# Patient Record
Sex: Female | Born: 1945 | Race: White | Hispanic: No | Marital: Married | Smoking: Never smoker
Health system: Southern US, Community
[De-identification: ages and names within clinical notes are randomized; demographics above are authoritative.]

## PROBLEM LIST (undated history)

## (undated) DIAGNOSIS — M3500 Sicca syndrome, unspecified: Secondary | ICD-10-CM

## (undated) DIAGNOSIS — K5909 Other constipation: Secondary | ICD-10-CM

## (undated) DIAGNOSIS — N200 Calculus of kidney: Secondary | ICD-10-CM

## (undated) DIAGNOSIS — K219 Gastro-esophageal reflux disease without esophagitis: Secondary | ICD-10-CM

## (undated) DIAGNOSIS — F419 Anxiety disorder, unspecified: Secondary | ICD-10-CM

## (undated) HISTORY — PX: DILATION AND CURETTAGE OF UTERUS: SHX78

## (undated) HISTORY — PX: ABDOMINAL HYSTERECTOMY: SHX81

## (undated) HISTORY — PX: COLONOSCOPY W/ BIOPSIES AND POLYPECTOMY: SHX1376

## (undated) HISTORY — PX: APPENDECTOMY: SHX54

## (undated) HISTORY — PX: TONSILLECTOMY: SUR1361

---

## 2006-06-16 DIAGNOSIS — M7750 Other enthesopathy of unspecified foot: Secondary | ICD-10-CM | POA: Insufficient documentation

## 2008-06-16 DIAGNOSIS — R1032 Left lower quadrant pain: Secondary | ICD-10-CM | POA: Insufficient documentation

## 2011-07-05 DIAGNOSIS — K146 Glossodynia: Secondary | ICD-10-CM | POA: Insufficient documentation

## 2011-07-05 DIAGNOSIS — R590 Localized enlarged lymph nodes: Secondary | ICD-10-CM | POA: Insufficient documentation

## 2011-12-02 DIAGNOSIS — G4700 Insomnia, unspecified: Secondary | ICD-10-CM | POA: Insufficient documentation

## 2011-12-27 DIAGNOSIS — F411 Generalized anxiety disorder: Secondary | ICD-10-CM | POA: Insufficient documentation

## 2011-12-27 DIAGNOSIS — F4322 Adjustment disorder with anxiety: Secondary | ICD-10-CM | POA: Insufficient documentation

## 2012-03-25 DIAGNOSIS — K5900 Constipation, unspecified: Secondary | ICD-10-CM | POA: Insufficient documentation

## 2012-03-25 DIAGNOSIS — K219 Gastro-esophageal reflux disease without esophagitis: Secondary | ICD-10-CM | POA: Insufficient documentation

## 2012-05-29 DIAGNOSIS — K219 Gastro-esophageal reflux disease without esophagitis: Secondary | ICD-10-CM | POA: Insufficient documentation

## 2012-05-29 DIAGNOSIS — R591 Generalized enlarged lymph nodes: Secondary | ICD-10-CM | POA: Insufficient documentation

## 2013-07-11 ENCOUNTER — Emergency Department (HOSPITAL_COMMUNITY)
Admission: EM | Admit: 2013-07-11 | Discharge: 2013-07-11 | Disposition: A | Discharge status: Home / Self Care | Payer: MEDICARE | Attending: Emergency Medicine | Admitting: Emergency Medicine

## 2013-07-11 ENCOUNTER — Emergency Department (HOSPITAL_COMMUNITY): Payer: MEDICARE

## 2013-07-11 ENCOUNTER — Encounter (HOSPITAL_COMMUNITY): Payer: Self-pay | Admitting: Emergency Medicine

## 2013-07-11 ENCOUNTER — Other Ambulatory Visit (HOSPITAL_COMMUNITY): Payer: Medicare Other

## 2013-07-11 DIAGNOSIS — Z79899 Other long term (current) drug therapy: Secondary | ICD-10-CM | POA: Insufficient documentation

## 2013-07-11 DIAGNOSIS — Z888 Allergy status to other drugs, medicaments and biological substances status: Secondary | ICD-10-CM | POA: Insufficient documentation

## 2013-07-11 DIAGNOSIS — W010XXA Fall on same level from slipping, tripping and stumbling without subsequent striking against object, initial encounter: Secondary | ICD-10-CM | POA: Insufficient documentation

## 2013-07-11 DIAGNOSIS — S0510XA Contusion of eyeball and orbital tissues, unspecified eye, initial encounter: Secondary | ICD-10-CM | POA: Insufficient documentation

## 2013-07-11 DIAGNOSIS — Z882 Allergy status to sulfonamides status: Secondary | ICD-10-CM | POA: Insufficient documentation

## 2013-07-11 DIAGNOSIS — S42209A Unspecified fracture of upper end of unspecified humerus, initial encounter for closed fracture: Secondary | ICD-10-CM | POA: Insufficient documentation

## 2013-07-11 DIAGNOSIS — M3500 Sicca syndrome, unspecified: Secondary | ICD-10-CM | POA: Insufficient documentation

## 2013-07-11 DIAGNOSIS — Y92009 Unspecified place in unspecified non-institutional (private) residence as the place of occurrence of the external cause: Secondary | ICD-10-CM | POA: Insufficient documentation

## 2013-07-11 DIAGNOSIS — Y93G3 Activity, cooking and baking: Secondary | ICD-10-CM | POA: Insufficient documentation

## 2013-07-11 DIAGNOSIS — R51 Headache: Secondary | ICD-10-CM | POA: Insufficient documentation

## 2013-07-11 DIAGNOSIS — S42201A Unspecified fracture of upper end of right humerus, initial encounter for closed fracture: Secondary | ICD-10-CM

## 2013-07-11 HISTORY — DX: Sjogren syndrome, unspecified: M35.00

## 2013-07-11 MED ORDER — OXYCODONE-ACETAMINOPHEN 5-325 MG PO TABS
1.0000 | ORAL_TABLET | Freq: Once | ORAL | Status: AC
  Administered 2013-07-11: 1 via ORAL
  Filled 2013-07-11: qty 1

## 2013-07-11 MED ORDER — OXYCODONE-ACETAMINOPHEN 5-325 MG PO TABS: 1.0000 | ORAL_TABLET | ORAL | Status: DC | PRN

## 2013-07-11 NOTE — Progress Notes (Signed)
Orthopedic Tech Progress Note Patient Details:  Sarah Costa March 28, 1946 [id number]  Ortho Devices Type of Ortho Device: Arm sling Ortho Device/Splint Interventions: Application   Shawnie Pons 07/11/2013, 12:42 PM

## 2013-07-11 NOTE — ED Notes (Signed)
Ortho called for sling 

## 2013-07-11 NOTE — ED Notes (Signed)
Pt reports turning around, tripped over the dog and fell. Landed on right shoulder and hit right side of face. Has swelling to right face and decreased rom right arm, +radial pulse.

## 2013-07-11 NOTE — ED Notes (Signed)
Dr Amanda Pea called and stated if pt needs ortho to call on call Supple

## 2013-07-11 NOTE — ED Provider Notes (Signed)
CSN: [id number]     Arrival date & time 07/11/13  1025 History   First MD Initiated Contact with Patient 07/11/13 1052     Chief Complaint  Patient presents with  . Fall  . Shoulder Pain   (Consider location/radiation/quality/duration/timing/severity/associated sxs/prior Treatment) The history is provided by the patient.   Patient reports she was making breakfast this morning and accidentally tripped over the dog, falling onto her right shoulder and right face.  Reports pain in her right upper arm and shoulder, worse with attempting to move it, 10/10 at worst, 0/10 at rest.  No weakness or numbness.  She hit her right face on the floor- denies any pain in her face despite the swelling.  Denies syncope.  Not on blood thinners.  Past Medical History  Diagnosis Date  . Sjogren's disease    History reviewed. No pertinent past surgical history. History reviewed. No pertinent family history. History  Substance Use Topics  . Smoking status: Never Smoker   . Smokeless tobacco: Not on file  . Alcohol Use: No   OB History   Grav Para Term Preterm Abortions TAB SAB Ect Mult Living                 Review of Systems  Cardiovascular: Negative for chest pain.  Gastrointestinal: Negative for abdominal pain.  Musculoskeletal: Negative for back pain and neck pain.  Skin: Negative for color change and wound.  Neurological: Negative for syncope, weakness, light-headedness, numbness and headaches.  Psychiatric/Behavioral: Negative for confusion.    Allergies  Aleve and Sulfa antibiotics  Home Medications   Current Outpatient Rx  Name  Route  Sig  Dispense  Refill  . ALPRAZolam (XANAX) 1 MG tablet   Oral   Take 1 mg by mouth at bedtime.         Marland Kitchen antiseptic oral rinse (BIOTENE) LIQD   Mouth Rinse   15 mLs by Mouth Rinse route 4 (four) times daily as needed for dry mouth.         . escitalopram (LEXAPRO) 10 MG tablet   Oral   Take 10 mg by mouth daily.         Marland Kitchen esomeprazole  (NEXIUM) 20 MG capsule   Oral   Take 20 mg by mouth 2 (two) times daily before a meal.         . Polyethyl Glycol-Propyl Glycol (SYSTANE OP)   Both Eyes   Place 1 drop into both eyes daily as needed (for dry eyes).          BP 130/67  Pulse 77  Temp(Src) 97.7 F (36.5 C) (Oral)  Resp 18  SpO2 99% Physical Exam  Nursing note and vitals reviewed. Constitutional: She appears well-developed and well-nourished. No distress.  HENT:  Head: Normocephalic. Head is with contusion.    Eyes: EOM are normal. No scleral icterus.  Neck: Normal range of motion. Neck supple.  Pulmonary/Chest: Effort normal.  Musculoskeletal:       Right shoulder: She exhibits decreased range of motion, tenderness, bony tenderness and pain. She exhibits no swelling, no effusion, no crepitus, no deformity, no laceration, no spasm and normal pulse.       Arms: Spine nontender, no crepitus, or stepoffs. Extremities with exception of RUE:  Strength 5/5, sensation intact, distal pulses intact.     Neurological: She is alert. No sensory deficit. She exhibits normal muscle tone. GCS eye subscore is 4. GCS verbal subscore is 5. GCS motor subscore is 6.  Moves all extremities  Skin: She is not diaphoretic. No pallor.  Psychiatric: She has a normal mood and affect. Her behavior is normal. Thought content normal.    ED Course  Procedures (including critical care time) Labs Review Labs Reviewed - No data to display Imaging Review No results found.  EKG Interpretation   None      Dr Wilkie Aye made aware of the patient.   Patient requests to f/u with Dr Rennis Chris.  MDM   1. Proximal humerus fracture, right, closed, initial encounter    Pt with accidental mechanical fall this morning onto her right arm and right face resulting in proximal humerus fracture.  Neurovascularly intact.  Not on blood thinners. No facial fracture.  No headache or neurologic complaints.  D/C home with sling, percocet, ortho follow up.   Discussed all results with patient.  Pt given return precautions.  Pt verbalizes understanding and agrees with plan.      Trixie Dredge, PA-C 07/11/13 1331

## 2013-07-13 NOTE — ED Provider Notes (Signed)
Medical screening examination/treatment/procedure(s) were conducted as a shared visit with non-physician practitioner(s) and myself.  I personally evaluated the patient during the encounter.  EKG Interpretation   None       Patient presents following mechanical fall.  HA and right shoulder pain.  NOt on anticoagulants.  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  CT neg.  RIght humerus fracture.  Patient will be placed in sling and follow-up with orthopedics.  Shon Baton, MD 07/13/13 [phone]

## 2013-07-20 ENCOUNTER — Encounter (HOSPITAL_COMMUNITY): Payer: Self-pay

## 2013-07-21 ENCOUNTER — Encounter (HOSPITAL_COMMUNITY): Payer: Self-pay | Admitting: *Deleted

## 2013-07-21 MED ORDER — DEXTROSE 5 % IV SOLN
3.0000 g | INTRAVENOUS | Status: AC
  Administered 2013-07-22: 2 g via INTRAVENOUS
  Filled 2013-07-21: qty 3000

## 2013-07-22 ENCOUNTER — Encounter (HOSPITAL_COMMUNITY): Payer: MEDICARE | Admitting: Anesthesiology

## 2013-07-22 ENCOUNTER — Encounter (HOSPITAL_COMMUNITY): Payer: Self-pay | Admitting: *Deleted

## 2013-07-22 ENCOUNTER — Ambulatory Visit (HOSPITAL_COMMUNITY): Payer: MEDICARE

## 2013-07-22 ENCOUNTER — Ambulatory Visit (HOSPITAL_COMMUNITY): Payer: MEDICARE | Admitting: Anesthesiology

## 2013-07-22 ENCOUNTER — Encounter (HOSPITAL_COMMUNITY)
Admission: RE | Disposition: A | Discharge status: Home / Self Care | Payer: Self-pay | Source: Ambulatory Visit | Attending: Orthopedic Surgery

## 2013-07-22 ENCOUNTER — Observation Stay (HOSPITAL_COMMUNITY)
Admission: RE | Admit: 2013-07-22 | Discharge: 2013-07-23 | Disposition: A | Discharge status: Home / Self Care | Payer: MEDICARE | Source: Ambulatory Visit | Attending: Orthopedic Surgery | Admitting: Orthopedic Surgery

## 2013-07-22 DIAGNOSIS — K5900 Constipation, unspecified: Secondary | ICD-10-CM | POA: Insufficient documentation

## 2013-07-22 DIAGNOSIS — S42213A Unspecified displaced fracture of surgical neck of unspecified humerus, initial encounter for closed fracture: Principal | ICD-10-CM | POA: Insufficient documentation

## 2013-07-22 DIAGNOSIS — M3500 Sicca syndrome, unspecified: Secondary | ICD-10-CM | POA: Insufficient documentation

## 2013-07-22 DIAGNOSIS — F411 Generalized anxiety disorder: Secondary | ICD-10-CM | POA: Insufficient documentation

## 2013-07-22 DIAGNOSIS — W19XXXA Unspecified fall, initial encounter: Secondary | ICD-10-CM | POA: Insufficient documentation

## 2013-07-22 DIAGNOSIS — S42209A Unspecified fracture of upper end of unspecified humerus, initial encounter for closed fracture: Secondary | ICD-10-CM

## 2013-07-22 DIAGNOSIS — K219 Gastro-esophageal reflux disease without esophagitis: Secondary | ICD-10-CM | POA: Insufficient documentation

## 2013-07-22 HISTORY — DX: Anxiety disorder, unspecified: F41.9

## 2013-07-22 HISTORY — DX: Calculus of kidney: N20.0

## 2013-07-22 HISTORY — PX: ORIF HUMERUS FRACTURE: SHX2126

## 2013-07-22 HISTORY — DX: Other constipation: K59.09

## 2013-07-22 HISTORY — DX: Gastro-esophageal reflux disease without esophagitis: K21.9

## 2013-07-22 LAB — COMPREHENSIVE METABOLIC PANEL
ALBUMIN: 3.6 g/dL (ref 3.5–5.2)
ALK PHOS: 99 U/L (ref 39–117)
ALT: 15 U/L (ref 0–35)
AST: 25 U/L (ref 0–37)
BUN: 17 mg/dL (ref 6–23)
CALCIUM: 9.3 mg/dL (ref 8.4–10.5)
CO2: 22 mEq/L (ref 19–32)
Chloride: 105 mEq/L (ref 96–112)
Creatinine, Ser: 0.8 mg/dL (ref 0.50–1.10)
GFR calc Af Amer: 86 mL/min — ABNORMAL LOW (ref >90)
GFR calc non Af Amer: 75 mL/min — ABNORMAL LOW (ref >90)
GLUCOSE: 95 mg/dL (ref 70–99)
POTASSIUM: 4.7 meq/L (ref 3.7–5.3)
SODIUM: 141 meq/L (ref 137–147)
Total Bilirubin: 0.6 mg/dL (ref 0.3–1.2)
Total Protein: 6.7 g/dL (ref 6.0–8.3)

## 2013-07-22 LAB — CBC WITH DIFFERENTIAL/PLATELET
BASOS PCT: 1 % (ref 0–1)
Basophils Absolute: 0 10*3/uL (ref 0.0–0.1)
EOS PCT: 3 % (ref 0–5)
Eosinophils Absolute: 0.1 10*3/uL (ref 0.0–0.7)
HEMATOCRIT: 34.7 % — AB (ref 36.0–46.0)
Hemoglobin: 11.4 g/dL — ABNORMAL LOW (ref 12.0–15.0)
Lymphocytes Relative: 14 % (ref 12–46)
Lymphs Abs: 0.5 10*3/uL — ABNORMAL LOW (ref 0.7–4.0)
MCH: 27.5 pg (ref 26.0–34.0)
MCHC: 32.9 g/dL (ref 30.0–36.0)
MCV: 83.6 fL (ref 78.0–100.0)
MONO ABS: 0.5 10*3/uL (ref 0.1–1.0)
MONOS PCT: 15 % — AB (ref 3–12)
NEUTROS ABS: 2.2 10*3/uL (ref 1.7–7.7)
Neutrophils Relative %: 68 % (ref 43–77)
Platelets: 264 10*3/uL (ref 150–400)
RBC: 4.15 MIL/uL (ref 3.87–5.11)
RDW: 15.2 % (ref 11.5–15.5)
WBC: 3.3 10*3/uL — ABNORMAL LOW (ref 4.0–10.5)

## 2013-07-22 LAB — APTT: APTT: 27 s (ref 24–37)

## 2013-07-22 LAB — PROTIME-INR
INR: 0.96 (ref 0.00–1.49)
Prothrombin Time: 12.6 seconds (ref 11.6–15.2)

## 2013-07-22 SURGERY — OPEN REDUCTION INTERNAL FIXATION (ORIF) PROXIMAL HUMERUS FRACTURE
Anesthesia: General | Site: Shoulder | Laterality: Right

## 2013-07-22 MED ORDER — ONDANSETRON HCL 4 MG/2ML IJ SOLN
INTRAMUSCULAR | Status: DC | PRN
  Administered 2013-07-22: 4 mg via INTRAVENOUS

## 2013-07-22 MED ORDER — ONDANSETRON HCL 4 MG PO TABS
4.0000 mg | ORAL_TABLET | Freq: Four times a day (QID) | ORAL | Status: DC | PRN
Start: 2013-07-22 — End: 2013-07-23

## 2013-07-22 MED ORDER — HYDROMORPHONE HCL 2 MG PO TABS
2.0000 mg | ORAL_TABLET | ORAL | Status: DC | PRN
  Administered 2013-07-23: 2 mg via ORAL
  Filled 2013-07-22: qty 1

## 2013-07-22 MED ORDER — LACTATED RINGERS IV SOLN
INTRAVENOUS | Status: DC
  Administered 2013-07-22: 14:00:00 via INTRAVENOUS

## 2013-07-22 MED ORDER — ONDANSETRON HCL 4 MG/2ML IJ SOLN: 4.0000 mg | Freq: Once | INTRAMUSCULAR | Status: DC | PRN

## 2013-07-22 MED ORDER — ZOLPIDEM TARTRATE 5 MG PO TABS: 5.0000 mg | ORAL_TABLET | Freq: Every evening | ORAL | Status: DC | PRN

## 2013-07-22 MED ORDER — DIAZEPAM 5 MG PO TABS
5.0000 mg | ORAL_TABLET | Freq: Four times a day (QID) | ORAL | Status: DC | PRN
  Administered 2013-07-22 – 2013-07-23 (×2): 5 mg via ORAL
  Filled 2013-07-22 (×2): qty 1

## 2013-07-22 MED ORDER — OSELTAMIVIR PHOSPHATE 75 MG PO CAPS
75.0000 mg | ORAL_CAPSULE | Freq: Two times a day (BID) | ORAL | Status: DC
  Administered 2013-07-22 – 2013-07-23 (×2): 75 mg via ORAL
  Filled 2013-07-22 (×4): qty 1

## 2013-07-22 MED ORDER — FLEET ENEMA 7-19 GM/118ML RE ENEM: 1.0000 | ENEMA | Freq: Once | RECTAL | Status: AC | PRN

## 2013-07-22 MED ORDER — WHITE PETROLATUM GEL
Status: AC
  Administered 2013-07-22
  Filled 2013-07-22: qty 5

## 2013-07-22 MED ORDER — ALBUMIN HUMAN 5 % IV SOLN
INTRAVENOUS | Status: DC | PRN
  Administered 2013-07-22: 16:00:00 via INTRAVENOUS

## 2013-07-22 MED ORDER — CEFAZOLIN SODIUM-DEXTROSE 2-3 GM-% IV SOLR
INTRAVENOUS | Status: AC
  Filled 2013-07-22: qty 50

## 2013-07-22 MED ORDER — CHLORHEXIDINE GLUCONATE 4 % EX LIQD: 60.0000 mL | Freq: Once | CUTANEOUS | Status: DC

## 2013-07-22 MED ORDER — CEFAZOLIN SODIUM 1-5 GM-% IV SOLN
1.0000 g | Freq: Four times a day (QID) | INTRAVENOUS | Status: AC
  Administered 2013-07-22 – 2013-07-23 (×3): 1 g via INTRAVENOUS
  Filled 2013-07-22 (×3): qty 50

## 2013-07-22 MED ORDER — 0.9 % SODIUM CHLORIDE (POUR BTL) OPTIME
TOPICAL | Status: DC | PRN
  Administered 2013-07-22: 1000 mL

## 2013-07-22 MED ORDER — TEMAZEPAM 15 MG PO CAPS: 15.0000 mg | ORAL_CAPSULE | Freq: Every evening | ORAL | Status: DC | PRN

## 2013-07-22 MED ORDER — PHENYLEPHRINE HCL 10 MG/ML IJ SOLN
10.0000 mg | INTRAVENOUS | Status: DC | PRN
  Administered 2013-07-22: 40 ug/min via INTRAVENOUS

## 2013-07-22 MED ORDER — DIPHENHYDRAMINE HCL 12.5 MG/5ML PO ELIX: 12.5000 mg | ORAL_SOLUTION | ORAL | Status: DC | PRN

## 2013-07-22 MED ORDER — LIDOCAINE HCL 4 % MT SOLN
OROMUCOSAL | Status: DC | PRN
  Administered 2013-07-22: 30 mL via TOPICAL

## 2013-07-22 MED ORDER — ROCURONIUM BROMIDE 100 MG/10ML IV SOLN
INTRAVENOUS | Status: DC | PRN
  Administered 2013-07-22: 15 mg via INTRAVENOUS
  Administered 2013-07-22: 25 mg via INTRAVENOUS

## 2013-07-22 MED ORDER — LACTATED RINGERS IV SOLN
INTRAVENOUS | Status: DC | PRN
  Administered 2013-07-22 (×2): via INTRAVENOUS

## 2013-07-22 MED ORDER — HYDROMORPHONE HCL PF 1 MG/ML IJ SOLN
INTRAMUSCULAR | Status: AC
  Administered 2013-07-22: 1 mg
  Filled 2013-07-22: qty 1

## 2013-07-22 MED ORDER — GLYCOPYRROLATE 0.2 MG/ML IJ SOLN
INTRAMUSCULAR | Status: DC | PRN
  Administered 2013-07-22: 0.2 mg via INTRAVENOUS

## 2013-07-22 MED ORDER — PHENOL 1.4 % MT LIQD: 1.0000 | OROMUCOSAL | Status: DC | PRN

## 2013-07-22 MED ORDER — FENTANYL CITRATE 0.05 MG/ML IJ SOLN
INTRAMUSCULAR | Status: AC
  Administered 2013-07-22: 100 ug
  Filled 2013-07-22: qty 2

## 2013-07-22 MED ORDER — METOCLOPRAMIDE HCL 5 MG/ML IJ SOLN: 5.0000 mg | Freq: Three times a day (TID) | INTRAMUSCULAR | Status: DC | PRN

## 2013-07-22 MED ORDER — BISACODYL 5 MG PO TBEC: 5.0000 mg | DELAYED_RELEASE_TABLET | Freq: Every day | ORAL | Status: DC | PRN

## 2013-07-22 MED ORDER — HYDROMORPHONE HCL PF 1 MG/ML IJ SOLN: 0.2500 mg | INTRAMUSCULAR | Status: DC | PRN

## 2013-07-22 MED ORDER — DOCUSATE SODIUM 100 MG PO CAPS
100.0000 mg | ORAL_CAPSULE | Freq: Two times a day (BID) | ORAL | Status: DC
  Administered 2013-07-22 – 2013-07-23 (×2): 100 mg via ORAL
  Filled 2013-07-22 (×2): qty 1

## 2013-07-22 MED ORDER — ALUM & MAG HYDROXIDE-SIMETH 200-200-20 MG/5ML PO SUSP: 30.0000 mL | ORAL | Status: DC | PRN

## 2013-07-22 MED ORDER — HYDROMORPHONE HCL PF 1 MG/ML IJ SOLN
0.2500 mg | INTRAMUSCULAR | Status: DC | PRN
  Administered 2013-07-22 – 2013-07-23 (×2): 1 mg via INTRAVENOUS
  Filled 2013-07-22 (×2): qty 1

## 2013-07-22 MED ORDER — PANTOPRAZOLE SODIUM 40 MG PO TBEC
40.0000 mg | DELAYED_RELEASE_TABLET | Freq: Every day | ORAL | Status: DC
  Administered 2013-07-22 – 2013-07-23 (×2): 40 mg via ORAL
  Filled 2013-07-22: qty 1

## 2013-07-22 MED ORDER — LIDOCAINE HCL (CARDIAC) 20 MG/ML IV SOLN
INTRAVENOUS | Status: DC | PRN
  Administered 2013-07-22: 70 mg via INTRAVENOUS

## 2013-07-22 MED ORDER — NEOSTIGMINE METHYLSULFATE 1 MG/ML IJ SOLN
INTRAMUSCULAR | Status: DC | PRN
  Administered 2013-07-22: 3 mg via INTRAVENOUS

## 2013-07-22 MED ORDER — ACETAMINOPHEN 650 MG RE SUPP: 650.0000 mg | Freq: Four times a day (QID) | RECTAL | Status: DC | PRN

## 2013-07-22 MED ORDER — MENTHOL 3 MG MT LOZG
LOZENGE | OROMUCOSAL | Status: AC
  Filled 2013-07-22: qty 9

## 2013-07-22 MED ORDER — MENTHOL 3 MG MT LOZG: 1.0000 | LOZENGE | OROMUCOSAL | Status: DC | PRN

## 2013-07-22 MED ORDER — PROPOFOL 10 MG/ML IV BOLUS
INTRAVENOUS | Status: DC | PRN
  Administered 2013-07-22: 150 mg via INTRAVENOUS

## 2013-07-22 MED ORDER — METOCLOPRAMIDE HCL 10 MG PO TABS: 5.0000 mg | ORAL_TABLET | Freq: Three times a day (TID) | ORAL | Status: DC | PRN

## 2013-07-22 MED ORDER — FENTANYL CITRATE 0.05 MG/ML IJ SOLN
INTRAMUSCULAR | Status: DC | PRN
  Administered 2013-07-22: 100 ug via INTRAVENOUS

## 2013-07-22 MED ORDER — LACTATED RINGERS IV SOLN: INTRAVENOUS | Status: DC

## 2013-07-22 MED ORDER — ESCITALOPRAM OXALATE 10 MG PO TABS
10.0000 mg | ORAL_TABLET | Freq: Every day | ORAL | Status: DC
  Administered 2013-07-23: 10 mg via ORAL
  Filled 2013-07-22 (×2): qty 1

## 2013-07-22 MED ORDER — ONDANSETRON HCL 4 MG/2ML IJ SOLN: 4.0000 mg | Freq: Four times a day (QID) | INTRAMUSCULAR | Status: DC | PRN

## 2013-07-22 MED ORDER — BUPIVACAINE-EPINEPHRINE PF 0.5-1:200000 % IJ SOLN
INTRAMUSCULAR | Status: DC | PRN
  Administered 2013-07-22: 16 mL

## 2013-07-22 MED ORDER — POLYETHYLENE GLYCOL 3350 17 G PO PACK: 17.0000 g | PACK | Freq: Every day | ORAL | Status: DC | PRN

## 2013-07-22 MED ORDER — ACETAMINOPHEN 325 MG PO TABS
650.0000 mg | ORAL_TABLET | Freq: Four times a day (QID) | ORAL | Status: DC | PRN
  Administered 2013-07-23: 650 mg via ORAL
  Filled 2013-07-22: qty 2

## 2013-07-22 SURGICAL SUPPLY — 65 items
BIT DRILL 2.5X110 QC LCP DISP (BIT) ×2 IMPLANT
BIT DRILL PERC QC 2.8X200 100 (BIT) ×1 IMPLANT
CLOTH BEACON ORANGE TIMEOUT ST (SAFETY) ×2 IMPLANT
CLSR STERI-STRIP ANTIMIC 1/2X4 (GAUZE/BANDAGES/DRESSINGS) ×2 IMPLANT
DRAPE INCISE IOBAN 66X45 STRL (DRAPES) ×2 IMPLANT
DRAPE POUCH INSTRU U-SHP 10X18 (DRAPES) ×2 IMPLANT
DRAPE SURG 17X11 SM STRL (DRAPES) ×2 IMPLANT
DRAPE U-SHAPE 47X51 STRL (DRAPES) ×2 IMPLANT
DRILL BIT QUICK COUP 2.8MM 100 (BIT) ×1
DRSG AQUACEL AG ADV 3.5X10 (GAUZE/BANDAGES/DRESSINGS) ×2 IMPLANT
DRSG EMULSION OIL 3X3 NADH (GAUZE/BANDAGES/DRESSINGS) IMPLANT
DRSG PAD ABDOMINAL 8X10 ST (GAUZE/BANDAGES/DRESSINGS) IMPLANT
ELECT REM PT RETURN 9FT ADLT (ELECTROSURGICAL) ×2
ELECTRODE REM PT RTRN 9FT ADLT (ELECTROSURGICAL) ×1 IMPLANT
GLOVE BIO SURGEON STRL SZ7 (GLOVE) ×2 IMPLANT
GLOVE BIO SURGEON STRL SZ7.5 (GLOVE) ×2 IMPLANT
GLOVE BIO SURGEON STRL SZ8 (GLOVE) ×4 IMPLANT
GLOVE BIOGEL PI IND STRL 7.0 (GLOVE) ×1 IMPLANT
GLOVE BIOGEL PI INDICATOR 7.0 (GLOVE) ×1
GLOVE EUDERMIC 7 POWDERFREE (GLOVE) ×2 IMPLANT
GLOVE SS BIOGEL STRL SZ 7.5 (GLOVE) ×1 IMPLANT
GLOVE SUPERSENSE BIOGEL SZ 7.5 (GLOVE) ×1
GLOVE SURG SS PI 7.0 STRL IVOR (GLOVE) ×2 IMPLANT
GLOVE SURG SS PI 8.5 STRL IVOR (GLOVE) ×2
GLOVE SURG SS PI 8.5 STRL STRW (GLOVE) ×2 IMPLANT
GOWN L4 LG 24 PK N/S (GOWN DISPOSABLE) ×2 IMPLANT
GOWN L4 XXLG W/PAP TWL (GOWN DISPOSABLE) ×6 IMPLANT
GOWN STRL NON-REIN LRG LVL3 (GOWN DISPOSABLE) IMPLANT
GOWN STRL REIN XL XLG (GOWN DISPOSABLE) IMPLANT
GOWN STRL REUS W/TWL XL LVL4 (GOWN DISPOSABLE) ×4 IMPLANT
KIT BASIN OR (CUSTOM PROCEDURE TRAY) ×2 IMPLANT
KIT ROOM TURNOVER OR (KITS) ×2 IMPLANT
MANIFOLD NEPTUNE II (INSTRUMENTS) ×2 IMPLANT
NEEDLE 22X1 1/2 (OR ONLY) (NEEDLE) IMPLANT
NS IRRIG 1000ML POUR BTL (IV SOLUTION) ×2 IMPLANT
PACK SHOULDER (CUSTOM PROCEDURE TRAY) ×2 IMPLANT
PAD ARMBOARD 7.5X6 YLW CONV (MISCELLANEOUS) ×4 IMPLANT
PLATE LCP 3.5 PROX HUM 3HX90 (Plate) ×2 IMPLANT
SCREW CORTEX 3.5 22MM (Screw) ×1 IMPLANT
SCREW CORTEX 3.5 24MM (Screw) ×2 IMPLANT
SCREW LOCK CORT ST 3.5X22 (Screw) ×1 IMPLANT
SCREW LOCK CORT ST 3.5X24 (Screw) ×2 IMPLANT
SCREW LOCK T15 FT 36X3.5X2.9X (Screw) ×3 IMPLANT
SCREW LOCK T15 FT 38X3.5XST (Screw) ×1 IMPLANT
SCREW LOCKING 3.5X34 (Screw) ×2 IMPLANT
SCREW LOCKING 3.5X36 (Screw) ×3 IMPLANT
SCREW LOCKING 3.5X38 (Screw) ×1 IMPLANT
SPONGE GAUZE 4X4 12PLY (GAUZE/BANDAGES/DRESSINGS) IMPLANT
SPONGE LAP 18X18 X RAY DECT (DISPOSABLE) ×2 IMPLANT
STRIP CLOSURE SKIN 1/2X4 (GAUZE/BANDAGES/DRESSINGS) IMPLANT
SUCTION FRAZIER TIP 10 FR DISP (SUCTIONS) ×2 IMPLANT
SUT ETHIBOND NAB CT1 #1 30IN (SUTURE) IMPLANT
SUT FIBERWIRE #2 38 T-5 BLUE (SUTURE)
SUT MNCRL AB 3-0 PS2 18 (SUTURE) ×2 IMPLANT
SUT VIC AB 0 CT1 27 (SUTURE) ×1
SUT VIC AB 0 CT1 27XBRD ANBCTR (SUTURE) ×1 IMPLANT
SUT VIC AB 2-0 CT1 27 (SUTURE) ×1
SUT VIC AB 2-0 CT1 TAPERPNT 27 (SUTURE) ×1 IMPLANT
SUT VICRYL 4-0 PS2 18IN ABS (SUTURE) IMPLANT
SUTURE FIBERWR #2 38 T-5 BLUE (SUTURE) IMPLANT
SYR CONTROL 10ML LL (SYRINGE) IMPLANT
TOWEL OR 17X24 6PK STRL BLUE (TOWEL DISPOSABLE) ×2 IMPLANT
TOWEL OR 17X26 10 PK STRL BLUE (TOWEL DISPOSABLE) ×2 IMPLANT
WATER STERILE IRR 1000ML POUR (IV SOLUTION) IMPLANT
YANKAUER SUCT BULB TIP NO VENT (SUCTIONS) ×2 IMPLANT

## 2013-07-22 NOTE — H&P (Signed)
Sarah Costa    Chief Complaint: right proximal humerous fracture HPI: The patient is a 67 y.o. female with a displaced right 3 part proximal humerus fracture  Past Medical History  Diagnosis Date  . Sjogren's disease   . Chronic constipation     Hx; of  . Anxiety   . GERD (gastroesophageal reflux disease)   . Stone, kidney     Hx: of    Past Surgical History  Procedure Laterality Date  . Abdominal hysterectomy    . Colonoscopy w/ biopsies and polypectomy      Hx: of   . Appendectomy    . Tonsillectomy    . Dilation and curettage of uterus      Family History  Problem Relation Age of Onset  . Hypertension Mother   . Diabetes Mother   . Stroke Mother   . Lymphoma Father   . Hypertension Brother     Social History:  reports that she has never smoked. She has never used smokeless tobacco. She reports that she does not drink alcohol or use illicit drugs.  Allergies:  Allergies  Allergen Reactions  . Aleve [Naproxen Sodium] Swelling  . Sulfa Antibiotics Swelling    Medications Prior to Admission  Medication Sig Dispense Refill  . antiseptic oral rinse (BIOTENE) LIQD 15 mLs by Mouth Rinse route 4 (four) times daily as needed for dry mouth.      . diazepam (VALIUM) 5 MG tablet Take 5 mg by mouth at bedtime as needed for anxiety.      Marland Kitchen escitalopram (LEXAPRO) 10 MG tablet Take 10 mg by mouth daily.      Marland Kitchen esomeprazole (NEXIUM) 20 MG capsule Take 20 mg by mouth 2 (two) times daily before a meal.      . HYDROmorphone (DILAUDID) 2 MG tablet Take 2-4 mg by mouth at bedtime as needed for severe pain.      Marland Kitchen oseltamivir (TAMIFLU) 75 MG capsule Take 75 mg by mouth.      Vladimir Faster Glycol-Propyl Glycol (SYSTANE OP) Place 1 drop into both eyes daily as needed (for dry eyes).      . polyethylene glycol powder (GLYCOLAX/MIRALAX) powder Take 1 Container by mouth once.         Physical Exam: right shoulder with painful and restricted motion and xrays showing displaced right 3  part proximal humerus fracture  Vitals  Temp:  [98.2 F (36.8 C)] 98.2 F (36.8 C) (01/08 1329) Pulse Rate:  [62-81] 63 (01/08 1422) Resp:  [13-20] 15 (01/08 1422) BP: (142)/(60) 142/60 mmHg (01/08 1329) SpO2:  [100 %] 100 % (01/08 1422) Weight:  [52.164 kg (115 lb)] 52.164 kg (115 lb) (01/08 1329)  Assessment/Plan  Impression: right proximal humerous fracture  Plan of Action: Procedure(s): OPEN REDUCTION INTERNAL FIXATION (ORIF) RIGHT PROXIMAL HUMERUS FRACTURE  Sarah Costa M 07/22/2013, 2:22 PM

## 2013-07-22 NOTE — Op Note (Signed)
07/22/2013  4:41 PM  PATIENT:   Sarah Costa  68 y.o. female  PRE-OPERATIVE DIAGNOSIS:  3 part right proximal humerus fracture  POST-OPERATIVE DIAGNOSIS:  Same  PROCEDURE:  ORIF with locking plate  SURGEON:  Ebonique Hallstrom, Metta Clines M.D.  ASSISTANTS: Shuford pac   ANESTHESIA:   GET + ISB  EBL: 200cc  SPECIMEN:  none  Drains: none   PATIENT DISPOSITION:  PACU - hemodynamically stable.    PLAN OF CARE: Admit for overnight observation  Dictation# [id number]

## 2013-07-22 NOTE — Anesthesia Preprocedure Evaluation (Signed)
Anesthesia Evaluation  Patient identified by MRN, date of birth, ID band Patient awake    Reviewed: Allergy & Precautions, H&P , NPO status , Patient's Chart, lab work & pertinent test results  Airway       Dental   Pulmonary          Cardiovascular     Neuro/Psych    GI/Hepatic GERD-  ,  Endo/Other    Renal/GU Renal disease     Musculoskeletal   Abdominal   Peds  Hematology   Anesthesia Other Findings   Reproductive/Obstetrics                           Anesthesia Physical Anesthesia Plan  ASA: II  Anesthesia Plan: General   Post-op Pain Management:    Induction: Intravenous  Airway Management Planned: Oral ETT  Additional Equipment:   Intra-op Plan:   Post-operative Plan: Extubation in OR  Informed Consent: I have reviewed the patients History and Physical, chart, labs and discussed the procedure including the risks, benefits and alternatives for the proposed anesthesia with the patient or authorized representative who has indicated his/her understanding and acceptance.     Plan Discussed with:   Anesthesia Plan Comments:         Anesthesia Quick Evaluation

## 2013-07-22 NOTE — Anesthesia Procedure Notes (Addendum)
Anesthesia Regional Block:  Interscalene brachial plexus block  Pre-Anesthetic Checklist: ,, timeout performed, Correct Patient, Correct Site, Correct Laterality, Correct Procedure, Correct Position, site marked, Risks and benefits discussed,  Surgical consent,  Pre-op evaluation,  At surgeon's request and post-op pain management  Laterality: Right  Prep: chloraprep and alcohol swabs       Needles:  Injection technique: Single-shot  Needle Type: Stimulator Needle - 40          Additional Needles:  Procedures: nerve stimulator Interscalene brachial plexus block  Nerve Stimulator or Paresthesia:  Response: 0.5 mA, 0.1 ms, 3 cm  Additional Responses:   Narrative:  Start time: 07/22/2013 2:15 PM End time: 07/22/2013 2:20 PM Injection made incrementally with aspirations every 5 mL.  Performed by: Personally  Anesthesiologist: Sharolyn Douglas MD  Additional Notes: Pt accepts procedure w/ risks. 16cc 0.5% Marcaine w/ epi w/o difficulty or discomfort. GES   Procedure Name: Intubation Date/Time: 07/22/2013 3:00 PM Performed by: Izora Gala Pre-anesthesia Checklist: Patient identified, Emergency Drugs available, Suction available and Patient being monitored Patient Re-evaluated:Patient Re-evaluated prior to inductionOxygen Delivery Method: Circle system utilized Preoxygenation: Pre-oxygenation with 100% oxygen Intubation Type: IV induction Ventilation: Mask ventilation without difficulty Laryngoscope Size: Miller and 3 Grade View: Grade I Tube type: Oral Tube size: 7.0 mm Number of attempts: 1 Airway Equipment and Method: Stylet Placement Confirmation: ETT inserted through vocal cords under direct vision,  positive ETCO2 and breath sounds checked- equal and bilateral Secured at: 20 cm Tube secured with: Tape Dental Injury: Teeth and Oropharynx as per pre-operative assessment

## 2013-07-22 NOTE — Transfer of Care (Signed)
Immediate Anesthesia Transfer of Care Note  Patient: Sarah Costa  Procedure(s) Performed: Procedure(s): OPEN REDUCTION INTERNAL FIXATION (ORIF) RIGHT PROXIMAL HUMERUS FRACTURE (Right)  Patient Location: PACU  Anesthesia Type:General  Level of Consciousness: awake, oriented and patient cooperative  Airway & Oxygen Therapy: Patient Spontanous Breathing and Patient connected to nasal cannula oxygen  Post-op Assessment: Report given to PACU RN and Post -op Vital signs reviewed and stable  Post vital signs: Reviewed and stable  Complications: No apparent anesthesia complications

## 2013-07-22 NOTE — Anesthesia Postprocedure Evaluation (Signed)
  Anesthesia Post-op Note  Patient: Sarah Costa  Procedure(s) Performed: Procedure(s): OPEN REDUCTION INTERNAL FIXATION (ORIF) RIGHT PROXIMAL HUMERUS FRACTURE (Right)  Patient Location: PACU  Anesthesia Type:GA combined with regional for post-op pain  Level of Consciousness: awake  Airway and Oxygen Therapy: Patient Spontanous Breathing  Post-op Pain: none  Post-op Assessment: Post-op Vital signs reviewed, Patient's Cardiovascular Status Stable, Respiratory Function Stable, Patent Airway, No signs of Nausea or vomiting and Pain level controlled  Post-op Vital Signs: Reviewed and stable  Complications: No apparent anesthesia complications

## 2013-07-23 MED ORDER — HYDROCODONE-ACETAMINOPHEN 5-325 MG PO TABS: 1.0000 | ORAL_TABLET | ORAL | Status: AC | PRN

## 2013-07-23 MED ORDER — HYDROMORPHONE HCL 2 MG PO TABS: 2.0000 mg | ORAL_TABLET | ORAL | Status: AC | PRN

## 2013-07-23 MED ORDER — DIAZEPAM 5 MG PO TABS: 2.5000 mg | ORAL_TABLET | Freq: Four times a day (QID) | ORAL | Status: AC | PRN

## 2013-07-23 NOTE — Discharge Summary (Signed)
PATIENT ID:      Sarah Costa  MRN:     [id number] DOB/AGE:    February 22, 1946 / 68 y.o.     DISCHARGE SUMMARY  ADMISSION DATE:    07/22/2013 DISCHARGE DATE:    ADMISSION DIAGNOSIS: right proximal humerus fracture Past Medical History  Diagnosis Date  . Sjogren's disease   . Chronic constipation     Hx; of  . Anxiety   . GERD (gastroesophageal reflux disease)   . Stone, kidney     Hx: of    DISCHARGE DIAGNOSIS:   Active Problems:   Proximal humerus fracture   PROCEDURE: Procedure(s): OPEN REDUCTION INTERNAL FIXATION (ORIF) RIGHT PROXIMAL HUMERUS FRACTURE on 07/22/2013  CONSULTS:   none  HISTORY:  See H&P in chart.  HOSPITAL COURSE:  Sarah Costa is a 68 y.o. admitted on 07/22/2013 with a chief complaint of right shoulder pain and fracture from mechanical fall, and found to have a diagnosis of right proximal humerus fracture.  They were brought to the operating room on 07/22/2013 and underwent Procedure(s): OPEN REDUCTION INTERNAL FIXATION (ORIF) RIGHT PROXIMAL HUMERUS FRACTURE.    They were given perioperative antibiotics: Anti-infectives   Start     Dose/Rate Route Frequency Ordered Stop   07/22/13 2200  oseltamivir (TAMIFLU) capsule 75 mg     75 mg Oral 2 times daily 07/22/13 2014 07/27/13 2159   07/22/13 2015  ceFAZolin (ANCEF) IVPB 1 g/50 mL premix     1 g 100 mL/hr over 30 Minutes Intravenous Every 6 hours 07/22/13 2014 07/23/13 1414   07/22/13 0600  ceFAZolin (ANCEF) 3 g in dextrose 5 % 50 mL IVPB     3 g 160 mL/hr over 30 Minutes Intravenous On call to O.R. 07/21/13 1355 07/22/13 1510    .  Patient underwent the above named procedure and tolerated it well. The following day they were hemodynamically stable and while still in pain it was controlled on oral analgesics. They were neurovascularly intact to the operative extremity. OT was ordered and worked with patient per protocol. They were medically and orthopaedically stable for discharge on day 1.    DIAGNOSTIC  STUDIES:  RECENT RADIOGRAPHIC STUDIES :  Dg Shoulder Right  07/11/2013   CLINICAL DATA:  Fall  EXAM: RIGHT SHOULDER - 2+ VIEW  COMPARISON:  None.  FINDINGS: Comminuted proximal right humerus fracture involves the neck. No obvious extension into the glenohumeral joint. There is displacement of the greater tuberosity.  IMPRESSION: Acute comminuted proximal humerus fracture.   Electronically Signed   By: Maryclare Bean M.D.   On: 07/11/2013 12:10   Dg Elbow 2 Views Right  07/11/2013   CLINICAL DATA:  Fall  EXAM: RIGHT ELBOW - 2 VIEW  COMPARISON:  None.  FINDINGS: No acute fracture.  No dislocation.  IMPRESSION: No acute bony pathology.   Electronically Signed   By: Maryclare Bean M.D.   On: 07/11/2013 12:10   Dg Humerus Right  07/22/2013   CLINICAL DATA:  Right humerus fracture.  ORIF.  EXAM: RIGHT HUMERUS - 2+ VIEW; DG C-ARM 61-120 MIN  COMPARISON:  Right shoulder radiographs 07/11/2013  FINDINGS: Single frontal projection intraoperative spot fluoroscopic image of the proximal right humerus is provided. This demonstrates interval reduction and lateral plate and screw fixation of the previously described right humeral neck fracture, which appears in near anatomic alignment on this single projection.  IMPRESSION: Intraoperative image during reduction and internal fixation of proximal right humerus fracture.   Electronically Signed   By:  Logan Bores   On: 07/22/2013 17:09   Dg C-arm 61-120 Min  07/22/2013   CLINICAL DATA:  Right humerus fracture.  ORIF.  EXAM: RIGHT HUMERUS - 2+ VIEW; DG C-ARM 61-120 MIN  COMPARISON:  Right shoulder radiographs 07/11/2013  FINDINGS: Single frontal projection intraoperative spot fluoroscopic image of the proximal right humerus is provided. This demonstrates interval reduction and lateral plate and screw fixation of the previously described right humeral neck fracture, which appears in near anatomic alignment on this single projection.  IMPRESSION: Intraoperative image during reduction  and internal fixation of proximal right humerus fracture.   Electronically Signed   By: Logan Bores   On: 07/22/2013 17:09   Ct Maxillofacial Wo Cm  07/11/2013   CLINICAL DATA:  Fall with right-sided facial trauma and hematoma.  EXAM: CT MAXILLOFACIAL WITHOUT CONTRAST  TECHNIQUE: Multidetector CT imaging of the maxillofacial structures was performed. Multiplanar CT image reconstructions were also generated. A small metallic BB was placed on the right temple in order to reliably differentiate right from left.  COMPARISON:  None.  FINDINGS: No acute maxillofacial fracture or dislocation is identified. Focal subcutaneous hematoma in the right maxillary region measures up to 3.5 cm in diameter. No foreign body is visualized. Minimal mucosal thickening is present in the lower left maxillary antrum. The nasal septum is in the midline. Orbits and extraocular musculature appear normal bilaterally.  IMPRESSION: Large right maxillary subcutaneous hematoma. No maxillofacial fracture is identified.   Electronically Signed   By: Aletta Edouard M.D.   On: 07/11/2013 12:02    RECENT VITAL SIGNS:  Patient Vitals for the past 24 hrs:  BP Temp Temp src Pulse Resp SpO2 Weight  07/23/13 0544 121/55 mmHg 101.3 F (38.5 C) - 94 18 92 % -  07/23/13 0119 110/57 mmHg 100.4 F (38 C) - 87 16 91 % -  07/22/13 1952 123/66 mmHg 98.1 F (36.7 C) - 59 16 99 % -  07/22/13 1930 - - - 63 18 100 % -  07/22/13 1845 106/46 mmHg - - 52 16 100 % -  07/22/13 1830 125/40 mmHg - - 51 16 100 % -  07/22/13 1815 110/44 mmHg 97.8 F (36.6 C) - 54 15 100 % -  07/22/13 1800 102/42 mmHg - - 58 13 100 % -  07/22/13 1745 105/37 mmHg - - 62 12 100 % -  07/22/13 1730 98/25 mmHg - - 63 14 98 % -  07/22/13 1715 81/30 mmHg - - 73 12 100 % -  07/22/13 1702 94/40 mmHg 97.5 F (36.4 C) - 74 17 100 % -  07/22/13 1442 117/54 mmHg - - 61 17 100 % -  07/22/13 1441 - - - 62 18 100 % -  07/22/13 1440 117/54 mmHg - - 60 16 100 % -  07/22/13 1438 - -  - 58 18 100 % -  07/22/13 1437 - - - 62 13 100 % -  07/22/13 1436 - - - 61 16 100 % -  07/22/13 1435 - - - 68 16 100 % -  07/22/13 1434 - - - 66 18 100 % -  07/22/13 1433 - - - 72 18 100 % -  07/22/13 1432 - - - 84 16 100 % -  07/22/13 1431 122/60 mmHg - - 61 18 100 % -  07/22/13 1430 - - - 153 15 100 % -  07/22/13 1429 - - - 68 22 100 % -  07/22/13 1428 - - -  69 11 100 % -  07/22/13 1427 - - - 65 16 100 % -  07/22/13 1426 138/60 mmHg - - 68 16 100 % -  07/22/13 1425 - - - 64 14 100 % -  07/22/13 1424 - - - 69 15 100 % -  07/22/13 1423 - - - 83 15 100 % -  07/22/13 1422 140/62 mmHg - - 63 15 100 % -  07/22/13 1421 - - - 64 19 100 % -  07/22/13 1420 - - - 71 19 100 % -  07/22/13 1419 - - - 66 18 100 % -  07/22/13 1418 - - - 81 20 100 % -  07/22/13 1417 - - - 78 16 100 % -  07/22/13 1416 - - - 68 17 100 % -  07/22/13 1415 - - - 62 14 100 % -  07/22/13 1414 - - - 68 14 100 % -  07/22/13 1413 - - - 74 14 100 % -  07/22/13 1412 - - - 62 14 100 % -  07/22/13 1411 - - - 67 13 100 % -  07/22/13 1329 142/60 mmHg 98.2 F (36.8 C) Oral 78 18 100 % 52.164 kg (115 lb)  .  RECENT EKG RESULTS:   No orders found for this or any previous visit.  DISCHARGE INSTRUCTIONS:    DISCHARGE MEDICATIONS:     Medication List         antiseptic oral rinse Liqd  15 mLs by Mouth Rinse route 4 (four) times daily as needed for dry mouth.     diazepam 5 MG tablet  Commonly known as:  VALIUM  Take 5 mg by mouth at bedtime as needed for anxiety.     diazepam 5 MG tablet  Commonly known as:  VALIUM  Take 0.5-1 tablets (2.5-5 mg total) by mouth every 6 (six) hours as needed for muscle spasms or sedation.     escitalopram 10 MG tablet  Commonly known as:  LEXAPRO  Take 10 mg by mouth daily.     esomeprazole 20 MG capsule  Commonly known as:  NEXIUM  Take 20 mg by mouth 2 (two) times daily before a meal.     HYDROcodone-acetaminophen 5-325 MG per tablet  Commonly known as:  NORCO  Take 1-2  tablets by mouth every 4 (four) hours as needed for moderate pain.     HYDROmorphone 2 MG tablet  Commonly known as:  DILAUDID  Take 2-4 mg by mouth at bedtime as needed for severe pain.     HYDROmorphone 2 MG tablet  Commonly known as:  DILAUDID  Take 1 tablet (2 mg total) by mouth every 4 (four) hours as needed for severe pain.     oseltamivir 75 MG capsule  Commonly known as:  TAMIFLU  Take 75 mg by mouth.     polyethylene glycol powder powder  Commonly known as:  GLYCOLAX/MIRALAX  Take 1 Container by mouth once.     SYSTANE OP  Place 1 drop into both eyes daily as needed (for dry eyes).        FOLLOW UP VISIT:       Follow-up Information   Follow up with Senaida LangeSUPPLE,KEVIN M, MD. (call to be seen in 10-14 days)    Specialty:  Orthopedic Surgery   Contact information:   [address] [place] [address] [phone]       DISCHARGE BJ:YNWGTO:home   DISPOSITION: Good  DISCHARGE CONDITION:  Good   Veron Senner for Dr. Caryn Bee Supple 07/23/2013, 8:30 AM

## 2013-07-23 NOTE — Op Note (Signed)
Sarah Costa, HUNGER NO.:  [id number]  MEDICAL RECORD NO.:  [id number]  LOCATION:  5N05C                        FACILITY:  [facility]  PHYSICIAN:  Metta Clines. Vennie Waymire, M.D.  DATE OF BIRTH:  [birth year]  DATE OF PROCEDURE:  07/22/2013 DATE OF DISCHARGE:                              OPERATIVE REPORT   PREOPERATIVE DIAGNOSIS:  Displaced right 3-part proximal humerus fracture.  POSTOPERATIVE DIAGNOSIS:  Displaced right 3-part proximal humerus fracture.  PROCEDURE:  Open reduction and internal fixation with a Synthes locking plate.  SURGEON:  Metta Clines. Azelie Noguera, MD  ASSISTANT:  Reather Laurence. Shuford, PA-C  ANESTHESIA:  General endotracheal as well as an interscalene block.  ESTIMATED BLOOD LOSS:  200 mL.  DRAINS:  None.  HISTORY:  Sarah Costa is a 68 year old female who fell onto the outstretched right upper extremity, sustaining a mild-to-moderate displaced right 3-part proximal humerus fracture which on followup x-ray shows further tendency towards displacement and due to the degree of displacement and potential for further loss of reduction, she was brought to the operating room at this time for planned open reduction and internal fixation.  Preoperatively, I counseled Sarah Costa on treatment options as well as risks versus benefits thereof.  Possible surgical complications were reviewed including potential for bleeding, infection, neurovascular injury, malunion, nonunion, loss of fixation, and possible need for additional surgery.  She understands and accepts and agrees with the planned procedure.  PROCEDURE IN DETAIL:  After undergoing routine preop evaluation, the patient received prophylactic antibiotics and an interscalene block was established in the holding area by the Anesthesia Department.  Placed supine on the operating table, underwent smooth induction of a general endotracheal anesthesia.  Placed in beach-chair position and appropriately padded and  protected.  The right shoulder girdle region was sterilely prepped and draped in standard fashion.  Time-out was called.  An anterior deltopectoral approach to the proximal humerus made through a 10-cm incision overlying the proximal humeral region, starting slightly distal to the coracoid.  Skin flaps were elevated and electrocautery was used for hemostasis.  The deltopectoral interval was then identified and developed from proximal to distal and then we divided adhesions in the subacromial/subdeltoid bursal region and then also carefully elevated the distal insertion of the deltoid, reflecting posteriorly, so that we could gain access to the humeral shaft just lateral to the long head biceps and its tendon.  I identified the bicipital groove proximally and then identified our fracture site, which involved the greater tuberosity and then the humeral neck.  We selected the 3-hole Synthes locking proximal humeral plate and temporarily placed this over the lateral margin of the proximal humerus.  Fluoroscopic imaging was then utilized.  We decided upon appropriate level of the plate and then had to perform a reduction maneuver, bringing the greater tuberosity somewhat lower to its anatomic position and then temporarily pinned this with a K-wire through the plate.  We used a clamp around the humeral shaft and the plate to gain temporary reduction and then went ahead and placed a single lag screw through the plate into the shaft, obtaining temporary fixation distally.  The overall position at this point was not quite to our satisfaction, so  our first screw into the shaft was removed.  We repositioned the plate and then again placed a temporary screw through the plate into the humeral shaft and then confirmed we had proper reduction of the greater tuberosity fragment and then pinned this with a K-wire.  At this point, now fluoroscopic images showed the overall position of the fracture and  position of the hardware to be much to our satisfaction.  At this point, we placed a series of five locking screws up into the humeral head using the standard guide to the locking technique and then completed fixation of plate to the shaft with two additional lag screws for a total of three screws into the shaft and five screws into the head.  At this time, fluoroscopic imaging was then performed and live fluoro was utilized to confirm that all hardware was in good position, so that the construct was very stable and much to our satisfaction.  The wound was then irrigated.  Hemostasis was obtained.  The deltopectoral interval was then reapproximated with a series of figure-of-eight 0 Vicryl sutures, 2-0 Vicryl used for the subcu layer and intracuticular 3-0 Monocryl for the skin followed by Steri-Strips.  Dry dressing was applied.  Right arm was placed in a sling.  The patient was awakened, extubated, and taken to the recovery room in stable condition.     Metta Clines. Chitara Clonch, M.D.     KMS/MEDQ  D:  07/22/2013  T:  07/23/2013  Job:  [id number]

## 2013-07-23 NOTE — Evaluation (Signed)
Occupational Therapy Evaluation Patient Details Name: Sarah Costa MRN: [id number] DOB: [birth year] Today's Date: 07/23/2013 Time: 5176-1607 OT Time Calculation (min): 26 min  OT Assessment / Plan / Recommendation History of present illness s/p ORIF of right proximal humerus fx   Clinical Impression   Pt s/p ORIF of right proximal humerus fx. Education completed. Pt and husband verbalized and demonstrated understanding of ADLs and RUE HEP (pendulums).  No further acute OT needed. Recommend progress rehab of shoulder as recommended by MD at f/u appt.    OT Assessment  Progress rehab of shoulder as ordered by MD at follow-up appointment    Follow Up Recommendations   (progress rehab of shoulder as recommended by MD)    Barriers to Discharge      Equipment Recommendations  None recommended by OT    Recommendations for Other Services    Frequency       Precautions / Restrictions Precautions Precautions: Shoulder Type of Shoulder Precautions: No ROM. Only pendulums. Shoulder Interventions: Shoulder sling/immobilizer;Off for dressing/bathing/exercises Precaution Booklet Issued: Yes (comment) Precaution Comments: reviewed handout with both pt and spouse Required Braces or Orthoses: Sling Restrictions Weight Bearing Restrictions: No   Pertinent Vitals/Pain See vitals    ADL  Eating/Feeding: Performed;Set up Where Assessed - Eating/Feeding: Edge of bed Grooming: Performed;Wash/dry hands;Wash/dry face;Set up Where Assessed - Grooming: Unsupported sitting Upper Body Dressing: Performed;Minimal assistance Where Assessed - Upper Body Dressing: Unsupported sitting Toilet Transfer: Simulated;Supervision/safety Toilet Transfer Method: Sit to Loss adjuster, chartered:  (bed) Equipment Used:  (RUE sling) Transfers/Ambulation Related to ADLs: supervision for safety ADL Comments: Educated both pt and husband on ADL techniques and RUE HEP (provided handout material). Pt has  been at home with humeral fx and sling for past week and is already familiar with how to perform self care.  Educated pt trying to keep RUE relaxed and to avoid elevating right shoulder as much as possible (tends to tense up).    OT Diagnosis:    OT Problem List:   OT Treatment Interventions:     OT Goals(Current goals can be found in the care plan section) Acute Rehab OT Goals Patient Stated Goal: to go home  Visit Information  Last OT Received On: 07/23/13 Assistance Needed: +1 History of Present Illness: s/p ORIF of right proximal humerus fx       Prior [place] expects to be discharged to:: Private residence Living Arrangements: Spouse/significant other Available Help at Discharge: Family;Available 24 hours/day Type of Home: House Home Equipment: None Additional Comments: Pt is from [place]. She experienced her mechanical fall and sustained humerus fx in [place].  Pt was on her way to [place] with her husband. At this time, they are unsure if they will still go to [place] in a few weeks or back to [place].  Pt and husband both confirm that wherever they go, the house will be accessible for pt, and she will have 24/7 assist as needed. Prior Function Level of Independence: Independent         Vision/Perception Vision - Assessment Additional Comments: Hematoma around right eye due to fall. No visual changes per pt.   Cognition  Cognition Arousal/Alertness: Awake/alert Behavior During Therapy: WFL for tasks assessed/performed Overall Cognitive Status: Within Functional Limits for tasks assessed    Extremity/Trunk Assessment Upper Extremity Assessment Upper Extremity Assessment: RUE deficits/detail RUE Deficits / Details: Strength and shoulder ROM not tested due to precautions. Hand, wrist and elbow ROM WFL.  Mobility Bed Mobility Overal bed mobility: Modified Independent General bed mobility comments: Pt has been sleeping in  recliner and sofa since fall. Transfers Overall transfer level: Modified independent General transfer comment: Incr time but no assist needed.     Exercise Shoulder Exercises Pendulum Exercise: PROM;Right;10 reps;Standing (3 exercises x 10 reps) Donning/doffing shirt without moving shoulder: Minimal assistance;Caregiver independent with task Method for sponge bathing under operated UE: Minimal assistance;Caregiver independent with task Donning/doffing sling/immobilizer: Minimal assistance;Caregiver independent with task Correct positioning of sling/immobilizer: Minimal assistance;Caregiver independent with task Pendulum exercises (written home exercise program): Supervision/safety;Caregiver independent with task ROM for elbow, wrist and digits of operated UE: Independent Sling wearing schedule (on at all times/off for ADL's): Independent Proper positioning of operated UE when showering: Minimal assistance;Caregiver independent with task   Balance     End of Session OT - End of Session Activity Tolerance: Patient tolerated treatment well Patient left: in bed;with call bell/phone within reach;with family/visitor present;with nursing/sitter in room Nurse Communication: Mobility status  GO Functional Assessment Tool Used: clinical judgment Functional Limitation: Self care Self Care Current Status (D3570): At least 1 percent but less than 20 percent impaired, limited or restricted Self Care Goal Status (V7793): At least 1 percent but less than 20 percent impaired, limited or restricted Self Care Discharge Status [phone]): At least 1 percent but less than 20 percent impaired, limited or restricted   07/23/2013 Darrol Jump OTR/L Pager [phone] Office [phone]  Darrol Jump 07/23/2013, 11:52 AM

## 2013-07-23 NOTE — Discharge Instructions (Signed)
Leave current dressing on until follow up. You may shower the dressing is waterproof You may come out of the sling to allow arm to dangle and perform pendulum exercises as instructed You can move elbow wrist and hand as tolerated. Use OTC stool softeners while on pain meds to help with constipation associated with pain med use

## 2013-07-26 ENCOUNTER — Encounter (HOSPITAL_COMMUNITY): Payer: Self-pay | Admitting: Orthopedic Surgery

## 2014-01-31 DIAGNOSIS — D472 Monoclonal gammopathy: Secondary | ICD-10-CM | POA: Insufficient documentation

## 2014-05-02 DIAGNOSIS — R590 Localized enlarged lymph nodes: Secondary | ICD-10-CM | POA: Insufficient documentation

## 2015-03-25 DIAGNOSIS — K120 Recurrent oral aphthae: Secondary | ICD-10-CM | POA: Insufficient documentation

## 2015-09-13 IMAGING — CT CT MAXILLOFACIAL W/O CM
3 series · 15 of 47 positions shown, 18 images · non-contrast
Comparison: None.

CLINICAL DATA: Fall with right-sided facial trauma and hematoma.

EXAM:
CT MAXILLOFACIAL WITHOUT CONTRAST
TECHNIQUE: Multidetector CT imaging of the maxillofacial structures was
performed. Multiplanar CT image reconstructions were also generated.
A small metallic BB was placed on the right temple in order to
reliably differentiate right from left.

[Series 2: facial/ orbits 2.0 h30s · axial · 0.33mm/px · z∈[+1360,+1498]mm · 9 of 81 slices shown, 12 images]
[im 6/81  brain]
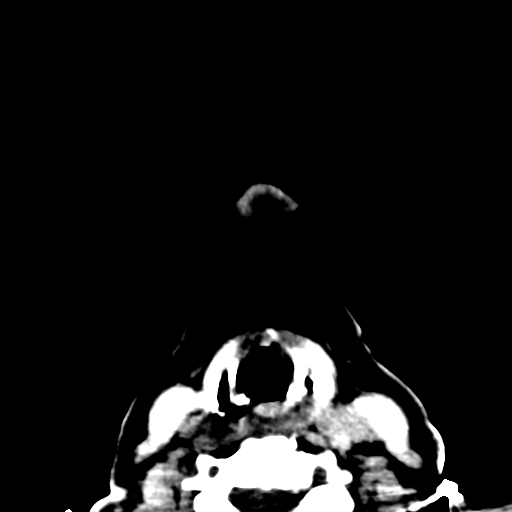
[im 6/81  bone]
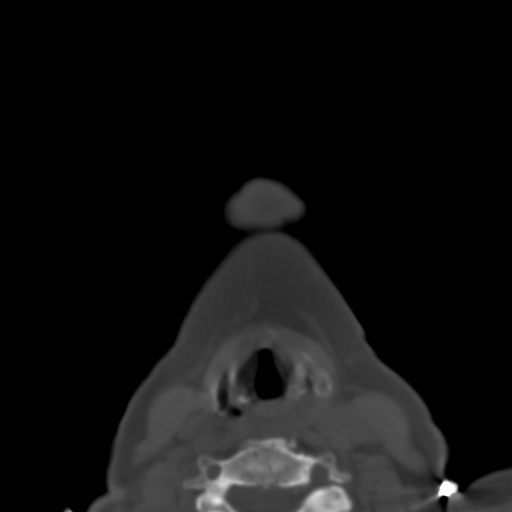
[im 14/81  bone]
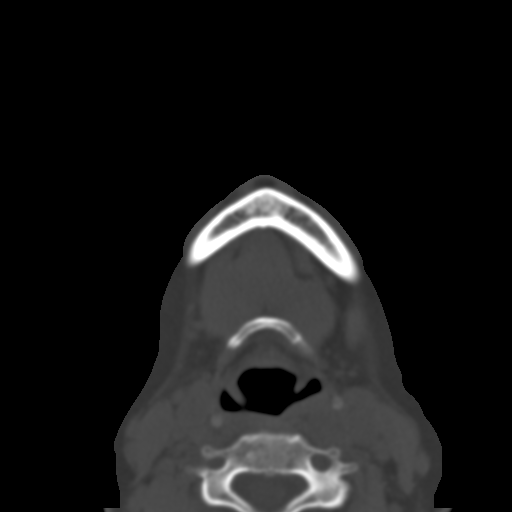
[im 23/81  bone]
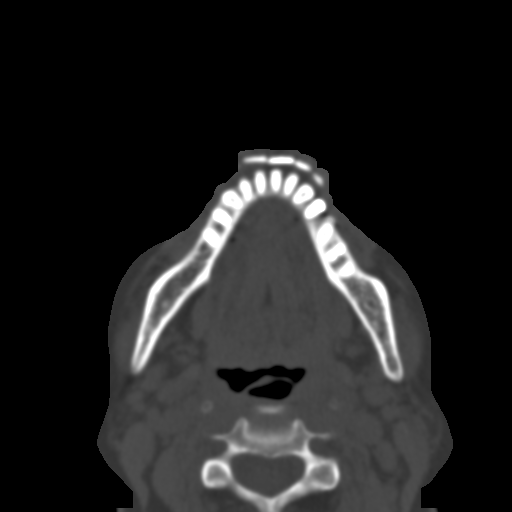
[im 31/81  bone]
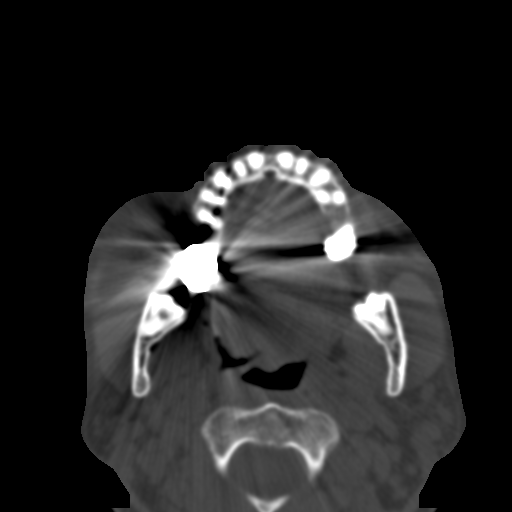
[im 42/81  brain]
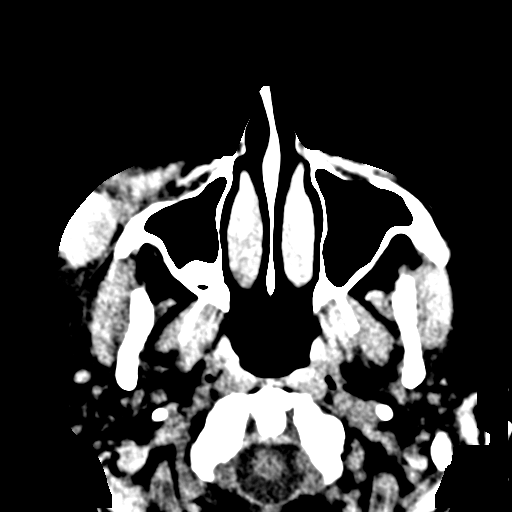
[im 42/81  bone]
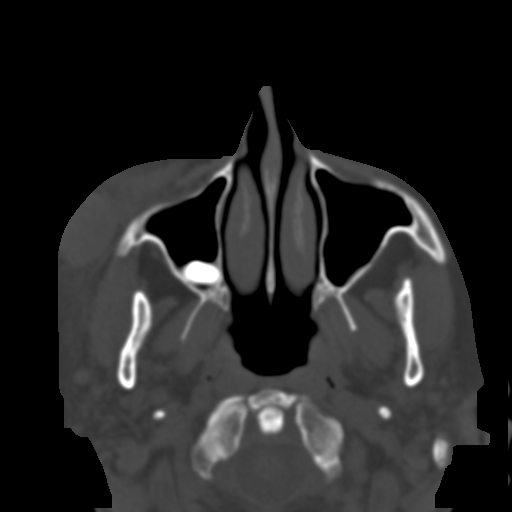
[im 50/81  bone]
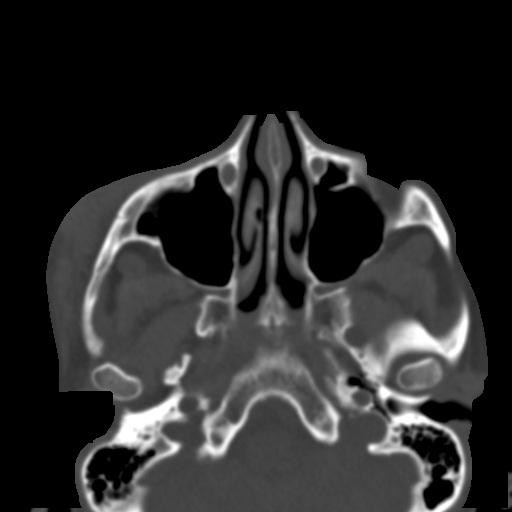
[im 58/81  bone]
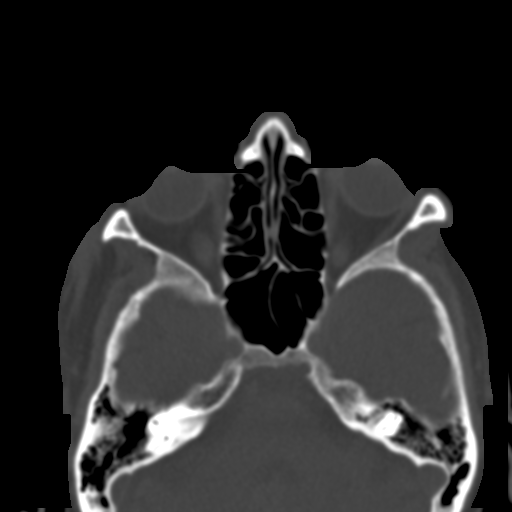
[im 67/81  bone]
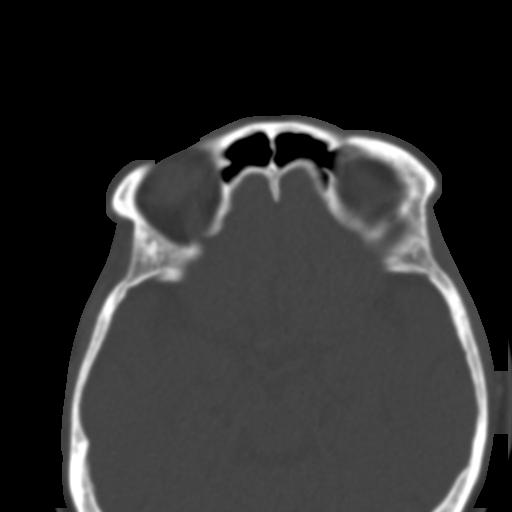
[im 75/81  brain]
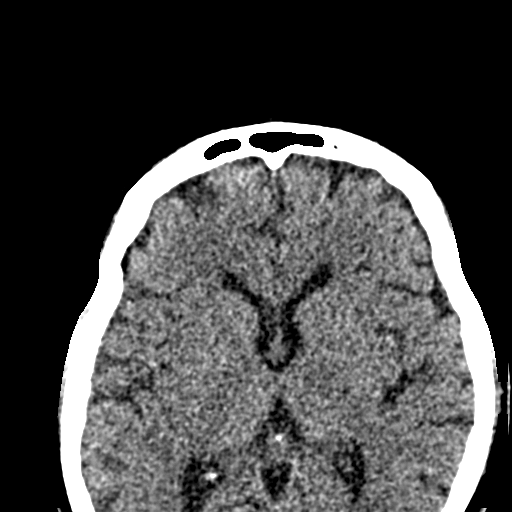
[im 75/81  bone]
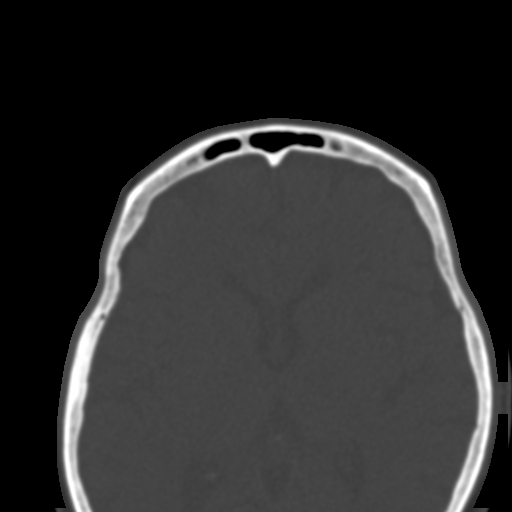

[Series 4: coronal st · coronal · 0.31mm/px · 3 of 76 slices shown]
[im 26/76  bone]
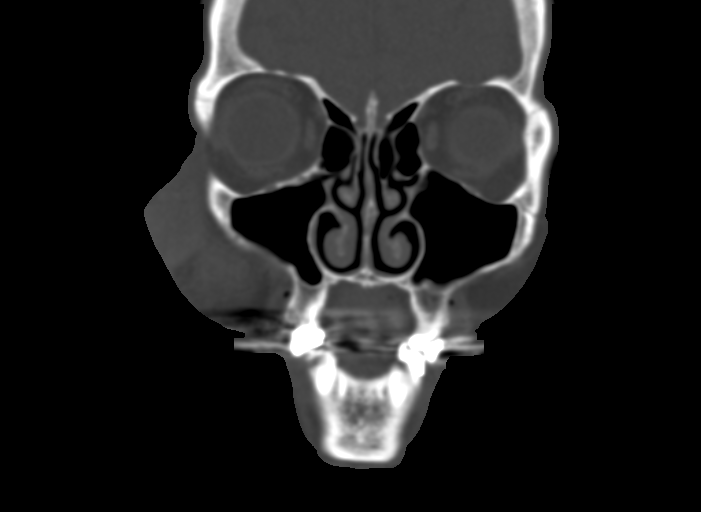
[im 34/76  bone]
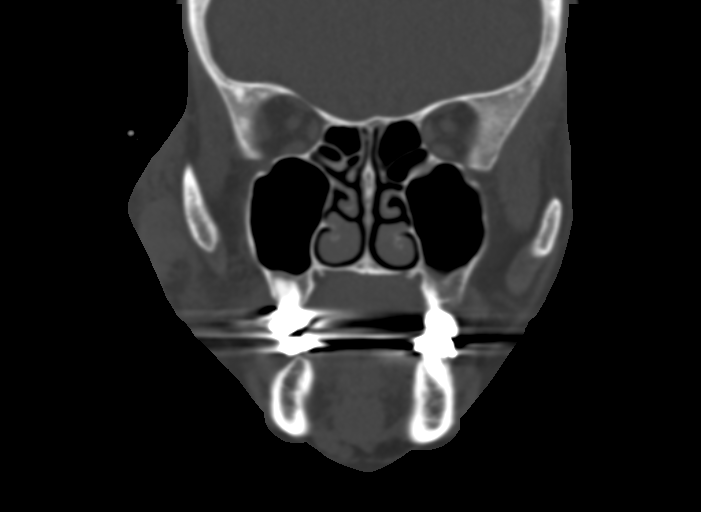
[im 42/76  bone]
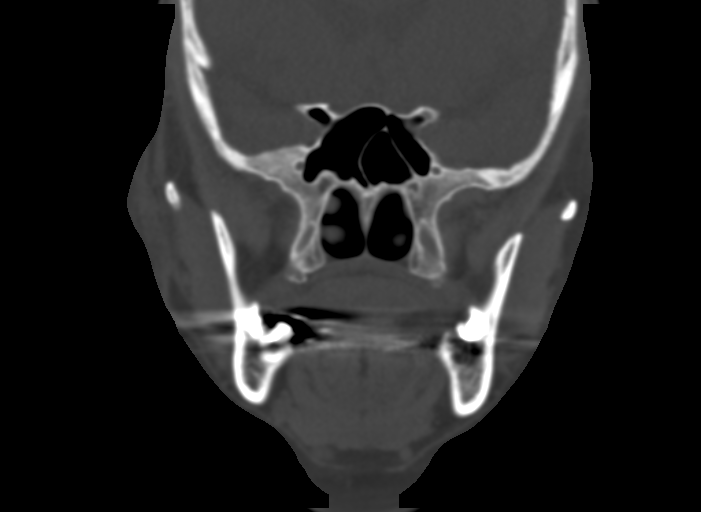

[Series 5: sagittal st · sagittal · 0.31mm/px · 3 of 78 slices shown]
[im 26/78  bone]
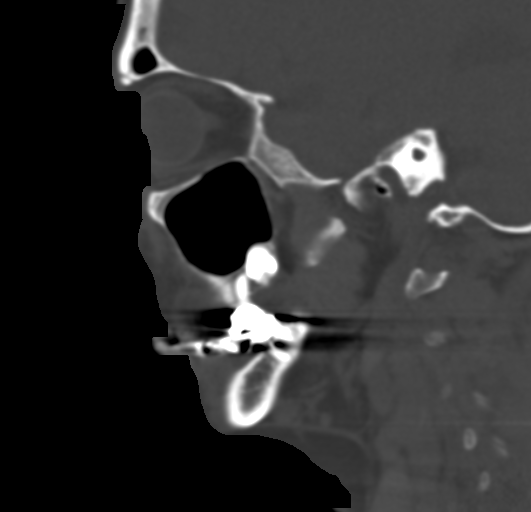
[im 39/78  bone]
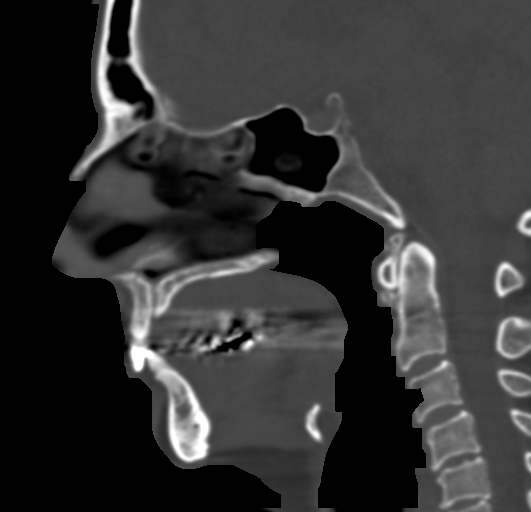
[im 52/78  bone]
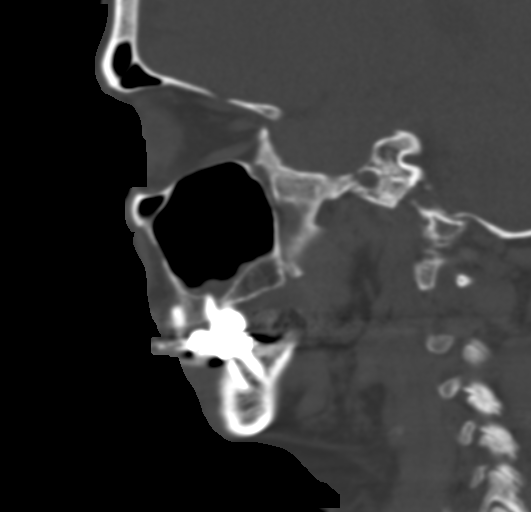

[15 of 47 positions shown; findings below may reference images not displayed]

FINDINGS: No acute maxillofacial fracture or dislocation is identified. Focal
subcutaneous hematoma in the right maxillary region measures up to
3.5 cm in diameter. No foreign body is visualized. Minimal mucosal
thickening is present in the lower left maxillary antrum. The nasal
septum is in the midline. Orbits and extraocular musculature appear
normal bilaterally.
IMPRESSION: Large right maxillary subcutaneous hematoma. No maxillofacial
fracture is identified.

## 2015-09-24 IMAGING — RF DG HUMERUS 2V *R*
1 series · 1 of 1 positions shown · non-contrast
Comparison: Right shoulder radiographs 07/11/2013

CLINICAL DATA: Right humerus fracture.  ORIF.

EXAM:
RIGHT HUMERUS - 2+ VIEW; DG C-ARM 61-120 MIN

[Series 1: run · 1 of 1 slices shown]
[im 1/1]
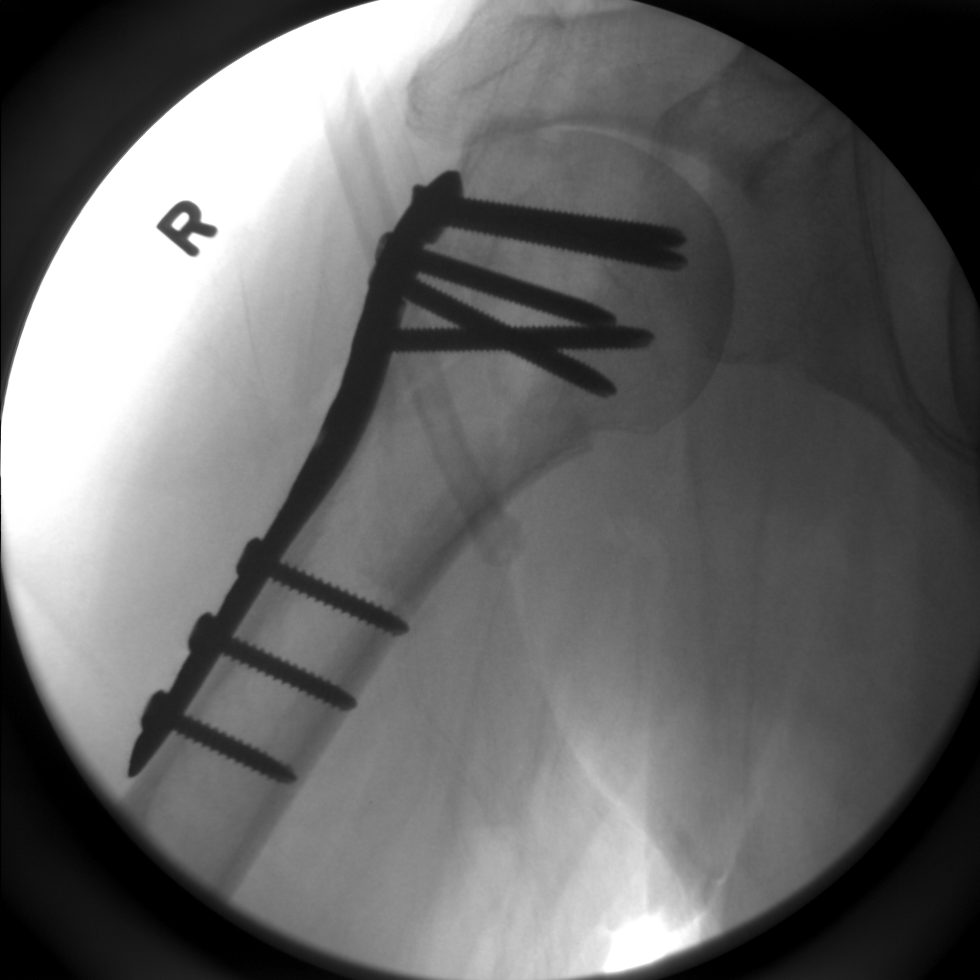

[1 of 1 positions shown; findings below may reference images not displayed]

FINDINGS: Single frontal projection intraoperative spot fluoroscopic image of
the proximal right humerus is provided. This demonstrates interval
reduction and lateral plate and screw fixation of the previously
described right humeral neck fracture, which appears in near
anatomic alignment on this single projection.
IMPRESSION: Intraoperative image during reduction and internal fixation of
proximal right humerus fracture.

## 2016-03-16 DIAGNOSIS — M810 Age-related osteoporosis without current pathological fracture: Secondary | ICD-10-CM | POA: Insufficient documentation

## 2016-06-19 DIAGNOSIS — C8338 Diffuse large B-cell lymphoma, lymph nodes of multiple sites: Secondary | ICD-10-CM | POA: Insufficient documentation

## 2016-06-27 DIAGNOSIS — S43409A Unspecified sprain of unspecified shoulder joint, initial encounter: Secondary | ICD-10-CM | POA: Insufficient documentation

## 2016-06-27 DIAGNOSIS — M799 Soft tissue disorder, unspecified: Secondary | ICD-10-CM | POA: Insufficient documentation

## 2016-06-27 DIAGNOSIS — R221 Localized swelling, mass and lump, neck: Secondary | ICD-10-CM | POA: Insufficient documentation

## 2016-07-11 DIAGNOSIS — F419 Anxiety disorder, unspecified: Secondary | ICD-10-CM | POA: Insufficient documentation

## 2016-07-26 DIAGNOSIS — D701 Agranulocytosis secondary to cancer chemotherapy: Secondary | ICD-10-CM | POA: Insufficient documentation

## 2016-07-26 DIAGNOSIS — T451X5A Adverse effect of antineoplastic and immunosuppressive drugs, initial encounter: Secondary | ICD-10-CM | POA: Insufficient documentation

## 2016-08-01 DIAGNOSIS — G528 Disorders of other specified cranial nerves: Secondary | ICD-10-CM | POA: Insufficient documentation

## 2016-11-08 DIAGNOSIS — D649 Anemia, unspecified: Secondary | ICD-10-CM | POA: Insufficient documentation

## 2017-04-04 DIAGNOSIS — D709 Neutropenia, unspecified: Secondary | ICD-10-CM | POA: Insufficient documentation

## 2017-07-18 ENCOUNTER — Encounter: Payer: Self-pay | Admitting: Podiatry

## 2017-07-18 ENCOUNTER — Ambulatory Visit (INDEPENDENT_AMBULATORY_CARE_PROVIDER_SITE_OTHER): Payer: Medicare Other | Admitting: Podiatry

## 2017-07-18 VITALS — BP 133/72 | HR 72 | Resp 18

## 2017-07-18 DIAGNOSIS — L539 Erythematous condition, unspecified: Secondary | ICD-10-CM

## 2017-07-18 DIAGNOSIS — L600 Ingrowing nail: Secondary | ICD-10-CM | POA: Diagnosis not present

## 2017-07-18 MED ORDER — CEPHALEXIN 500 MG PO CAPS: 500.0000 mg | ORAL_CAPSULE | Freq: Three times a day (TID) | ORAL | 0 refills | Status: AC

## 2017-07-18 NOTE — Patient Instructions (Signed)

## 2017-07-21 NOTE — Progress Notes (Signed)
Subjective:   Patient ID: Sarah Costa, female   DOB: 72 y.o.   MRN: [id number]   HPI 72 year old female presents the office today for concerns of possible infection to her left big toe.  She states that she had an infection to the ingrown toenail which resolved and she underwent a partial nail avulsion with laser matricectomy in [place] before Christmas.  She states that she is been having some clear drainage of the area and she has noticed some mild redness around the area.  She recently just finished chemotherapy earlier in the year actually is concerned that she may get an infection.  She is currently not taking any antibiotics.  She has no other concerns.   Review of Systems  All other systems reviewed and are negative.  Past Medical History:  Diagnosis Date  . Anxiety   . Chronic constipation    Hx; of  . GERD (gastroesophageal reflux disease)   . Sjogren's disease ([place])   . Stone, kidney    Hx: of    Past Surgical History:  Procedure Laterality Date  . ABDOMINAL HYSTERECTOMY    . APPENDECTOMY    . COLONOSCOPY W/ BIOPSIES AND POLYPECTOMY     Hx: of   . DILATION AND CURETTAGE OF UTERUS    . ORIF HUMERUS FRACTURE Right 07/22/2013   Procedure: OPEN REDUCTION INTERNAL FIXATION (ORIF) RIGHT PROXIMAL HUMERUS FRACTURE;  Surgeon: Marin Shutter, MD;  Location: [facility];  Service: Orthopedics;  Laterality: Right;  . TONSILLECTOMY       Current Outpatient Medications:  .  ALPRAZolam (XANAX PO), Take by mouth., Disp: , Rfl:  .  BIOTIN PO, Take by mouth., Disp: , Rfl:  .  antiseptic oral rinse (BIOTENE) LIQD, 15 mLs by Mouth Rinse route 4 (four) times daily as needed for dry mouth., Disp: , Rfl:  .  cephALEXin (KEFLEX) 500 MG capsule, Take 1 capsule (500 mg total) by mouth 3 (three) times daily., Disp: 30 capsule, Rfl: 0 .  diazepam (VALIUM) 5 MG tablet, Take 5 mg by mouth at bedtime as needed for anxiety., Disp: , Rfl:  .  diazepam (VALIUM) 5 MG tablet, Take 0.5-1 tablets (2.5-5 mg  total) by mouth every 6 (six) hours as needed for muscle spasms or sedation. (Patient not taking: Reported on 07/18/2017), Disp: 30 tablet, Rfl: 1 .  escitalopram (LEXAPRO) 10 MG tablet, Take 10 mg by mouth daily., Disp: , Rfl:  .  esomeprazole (NEXIUM) 20 MG capsule, Take 20 mg by mouth 2 (two) times daily before a meal., Disp: , Rfl:  .  HYDROcodone-acetaminophen (NORCO) 5-325 MG per tablet, Take 1-2 tablets by mouth every 4 (four) hours as needed for moderate pain., Disp: 50 tablet, Rfl: 0 .  HYDROmorphone (DILAUDID) 2 MG tablet, Take 2-4 mg by mouth at bedtime as needed for severe pain., Disp: , Rfl:  .  HYDROmorphone (DILAUDID) 2 MG tablet, Take 1 tablet (2 mg total) by mouth every 4 (four) hours as needed for severe pain. (Patient not taking: Reported on 07/18/2017), Disp: 40 tablet, Rfl: 0 .  oseltamivir (TAMIFLU) 75 MG capsule, Take 75 mg by mouth., Disp: , Rfl:  .  Polyethyl Glycol-Propyl Glycol (SYSTANE OP), Place 1 drop into both eyes daily as needed (for dry eyes)., Disp: , Rfl:  .  polyethylene glycol powder (GLYCOLAX/MIRALAX) powder, Take 1 Container by mouth once., Disp: , Rfl:   Allergies  Allergen Reactions  . Aleve [Naproxen Sodium] Swelling  . Sulfa Antibiotics Swelling  Social History   Socioeconomic History  . Marital status: Married    Spouse name: Not on file  . Number of children: Not on file  . Years of education: Not on file  . Highest education level: Not on file  Social Needs  . Financial resource strain: Not on file  . Food insecurity - worry: Not on file  . Food insecurity - inability: Not on file  . Transportation needs - medical: Not on file  . Transportation needs - non-medical: Not on file  Occupational History  . Not on file  Tobacco Use  . Smoking status: Never Smoker  . Smokeless tobacco: Never Used  Substance and Sexual Activity  . Alcohol use: No  . Drug use: No  . Sexual activity: Not on file  Other Topics Concern  . Not on file  Social  History Narrative  . Not on file        Objective:  Physical Exam  General: AAO x3, NAD  Dermatological: Status post partial nail avulsion of the left hallux.  There is a scab and some granular tissue present within the nail corner and there is faint surrounding erythema but there is no ascending cellulitis.  Small amount of clear drainage is expressed but there is no purulence.  No increase in warmth and there is no ascending cellulitis.  There is no significant tenderness palpation to the area.  Vascular: Dorsalis Pedis artery and Posterior Tibial artery pedal pulses are 2/4 bilateral with immedate capillary fill time. There is no pain with calf compression, swelling, warmth, erythema.   Neruologic: Grossly intact via light touch bilateral. Protective threshold with Semmes Wienstein monofilament intact to all pedal sites bilateral.   Musculoskeletal: No gross boney pedal deformities bilateral. No pain, crepitus, or limitation noted with foot and ankle range of motion bilateral. Muscular strength 5/5 in all groups tested bilateral.  Gait: Unassisted, Nonantalgic.       Assessment:   Localized erythema status post partial nail avulsion    Plan:  -Treatment options discussed including all alternatives, risks, and complications -Etiology of symptoms were discussed -Given small amount of erythema and a recent procedure as well as her history will start Keflex.  I also want her to start Epsom salts soaks twice a day cover with antibiotic ointment and a bandage daily. Monitor for any clinical signs or symptoms of infection and directed to call the office immediately should any occur or go to the ER. -Follow-up in 2 weeks or sooner if needed.  Trula Slade DPM

## 2017-08-01 ENCOUNTER — Ambulatory Visit: Payer: Medicare Other | Admitting: Podiatry

## 2018-01-06 DIAGNOSIS — M7582 Other shoulder lesions, left shoulder: Secondary | ICD-10-CM | POA: Insufficient documentation

## 2018-08-14 DIAGNOSIS — Z452 Encounter for adjustment and management of vascular access device: Secondary | ICD-10-CM | POA: Insufficient documentation

## 2019-08-03 ENCOUNTER — Other Ambulatory Visit: Payer: Self-pay

## 2019-08-03 DIAGNOSIS — M25569 Pain in unspecified knee: Secondary | ICD-10-CM | POA: Insufficient documentation

## 2019-08-03 DIAGNOSIS — S83249A Other tear of medial meniscus, current injury, unspecified knee, initial encounter: Secondary | ICD-10-CM | POA: Insufficient documentation

## 2019-08-06 ENCOUNTER — Other Ambulatory Visit: Payer: Self-pay

## 2019-08-06 ENCOUNTER — Inpatient Hospital Stay: Payer: Medicare Other | Attending: Internal Medicine

## 2019-08-06 DIAGNOSIS — C8338 Diffuse large B-cell lymphoma, lymph nodes of multiple sites: Secondary | ICD-10-CM | POA: Insufficient documentation

## 2019-08-06 DIAGNOSIS — Z452 Encounter for adjustment and management of vascular access device: Secondary | ICD-10-CM | POA: Diagnosis not present

## 2019-08-23 ENCOUNTER — Ambulatory Visit: Payer: Medicare Other | Attending: Internal Medicine

## 2019-08-23 DIAGNOSIS — Z23 Encounter for immunization: Secondary | ICD-10-CM

## 2019-08-23 NOTE — Progress Notes (Signed)
   Covid-19 Vaccination Clinic  Name:  [name]    MRN: [id number] DOB: 02/03/1946  08/23/2019  [name] was observed post Covid-19 immunization for 15 minutes without incidence. She was provided with Vaccine Information Sheet and instruction to access the V-Safe system.   [name] was instructed to call [phone] with any severe reactions post vaccine: Marland Kitchen Difficulty breathing  . Swelling of your face and throat  . A fast heartbeat  . A bad rash all over your body  . Dizziness and weakness    Immunizations Administered    Name Date Dose VIS Date Route   Pfizer COVID-19 Vaccine 08/23/2019  2:19 PM 0.3 mL 06/25/2019 Intramuscular   Manufacturer: [place]   Lot: [id number]   [place]: [id number]

## 2019-09-17 ENCOUNTER — Ambulatory Visit: Payer: Medicare Other

## 2020-07-24 DIAGNOSIS — J45909 Unspecified asthma, uncomplicated: Secondary | ICD-10-CM | POA: Insufficient documentation

## 2020-07-24 DIAGNOSIS — IMO0002 Reserved for concepts with insufficient information to code with codable children: Secondary | ICD-10-CM | POA: Insufficient documentation

## 2020-07-24 DIAGNOSIS — E785 Hyperlipidemia, unspecified: Secondary | ICD-10-CM | POA: Insufficient documentation

## 2020-07-24 DIAGNOSIS — R7302 Impaired glucose tolerance (oral): Secondary | ICD-10-CM | POA: Insufficient documentation

## 2020-07-24 DIAGNOSIS — M6289 Other specified disorders of muscle: Secondary | ICD-10-CM | POA: Insufficient documentation

## 2020-07-24 DIAGNOSIS — M3500 Sicca syndrome, unspecified: Secondary | ICD-10-CM | POA: Insufficient documentation

## 2020-07-24 DIAGNOSIS — R599 Enlarged lymph nodes, unspecified: Secondary | ICD-10-CM | POA: Insufficient documentation

## 2020-07-24 DIAGNOSIS — N202 Calculus of kidney with calculus of ureter: Secondary | ICD-10-CM | POA: Insufficient documentation

## 2020-07-24 DIAGNOSIS — J309 Allergic rhinitis, unspecified: Secondary | ICD-10-CM | POA: Insufficient documentation

## 2020-07-24 DIAGNOSIS — M545 Low back pain, unspecified: Secondary | ICD-10-CM | POA: Insufficient documentation

## 2020-07-26 ENCOUNTER — Other Ambulatory Visit: Payer: Self-pay

## 2020-07-26 ENCOUNTER — Inpatient Hospital Stay: Payer: Medicare Other | Attending: Oncology

## 2020-07-26 VITALS — BP 146/82 | HR 76 | Resp 18

## 2020-07-26 DIAGNOSIS — Z95828 Presence of other vascular implants and grafts: Secondary | ICD-10-CM

## 2020-07-26 DIAGNOSIS — Z452 Encounter for adjustment and management of vascular access device: Secondary | ICD-10-CM | POA: Insufficient documentation

## 2020-07-26 DIAGNOSIS — C8338 Diffuse large B-cell lymphoma, lymph nodes of multiple sites: Secondary | ICD-10-CM | POA: Insufficient documentation

## 2020-07-26 MED ORDER — HEPARIN SOD (PORK) LOCK FLUSH 100 UNIT/ML IV SOLN
500.0000 [IU] | Freq: Once | INTRAVENOUS | Status: AC
  Administered 2020-07-26: 500 [IU] via INTRAVENOUS
  Filled 2020-07-26: qty 5

## 2020-07-26 MED ORDER — SODIUM CHLORIDE 0.9% FLUSH
10.0000 mL | Freq: Once | INTRAVENOUS | Status: AC
  Administered 2020-07-26: 10 mL via INTRAVENOUS
  Filled 2020-07-26: qty 10

## 2020-07-26 NOTE — Patient Instructions (Signed)

## 2022-08-08 ENCOUNTER — Inpatient Hospital Stay: Payer: Medicare Other | Attending: Internal Medicine

## 2022-08-08 DIAGNOSIS — C8338 Diffuse large B-cell lymphoma, lymph nodes of multiple sites: Secondary | ICD-10-CM | POA: Insufficient documentation

## 2023-07-22 ENCOUNTER — Other Ambulatory Visit: Payer: Self-pay

## 2023-07-23 ENCOUNTER — Inpatient Hospital Stay: Payer: Medicare Other | Attending: Internal Medicine

## 2023-07-23 DIAGNOSIS — C8338 Diffuse large B-cell lymphoma, lymph nodes of multiple sites: Secondary | ICD-10-CM | POA: Insufficient documentation

## 2023-07-23 MED ORDER — SODIUM CHLORIDE 0.9% FLUSH
10.0000 mL | Freq: Once | INTRAVENOUS | Status: AC
  Administered 2023-07-23: 10 mL

## 2023-07-23 MED ORDER — HEPARIN SOD (PORK) LOCK FLUSH 100 UNIT/ML IV SOLN
500.0000 [IU] | Freq: Once | INTRAVENOUS | Status: AC
  Administered 2023-07-23: 500 [IU]
